# Patient Record
Sex: Female | Born: 2000 | Race: White | Hispanic: No | Marital: Single | Smoking: Never smoker
Health system: Southern US, Community
[De-identification: ages and names within clinical notes are randomized; demographics above are authoritative.]

---
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
## 2021-01-01 ENCOUNTER — Other Ambulatory Visit: Payer: Self-pay

## 2021-01-01 ENCOUNTER — Emergency Department (HOSPITAL_COMMUNITY): Payer: No Typology Code available for payment source

## 2021-01-01 ENCOUNTER — Encounter (HOSPITAL_COMMUNITY): Payer: Self-pay | Admitting: Emergency Medicine

## 2021-01-01 ENCOUNTER — Emergency Department (HOSPITAL_COMMUNITY)
Admission: EM | Admit: 2021-01-01 | Discharge: 2021-01-01 | Disposition: A | Discharge status: Home / Self Care | Payer: No Typology Code available for payment source | Attending: Emergency Medicine | Admitting: Emergency Medicine

## 2021-01-01 DIAGNOSIS — R091 Pleurisy: Secondary | ICD-10-CM

## 2021-01-01 DIAGNOSIS — I498 Other specified cardiac arrhythmias: Secondary | ICD-10-CM | POA: Diagnosis not present

## 2021-01-01 DIAGNOSIS — R0602 Shortness of breath: Secondary | ICD-10-CM | POA: Diagnosis present

## 2021-01-01 LAB — CBC
HCT: 42.9 % (ref 36.0–46.0)
Hemoglobin: 13.7 g/dL (ref 12.0–15.0)
MCH: 28.8 pg (ref 26.0–34.0)
MCHC: 31.9 g/dL (ref 30.0–36.0)
MCV: 90.3 fL (ref 80.0–100.0)
Platelets: 230 10*3/uL (ref 150–400)
RBC: 4.75 MIL/uL (ref 3.87–5.11)
RDW: 12.9 % (ref 11.5–15.5)
WBC: 5.3 10*3/uL (ref 4.0–10.5)
nRBC: 0 % (ref 0.0–0.2)

## 2021-01-01 LAB — BASIC METABOLIC PANEL
Anion gap: 6 (ref 5–15)
BUN: 8 mg/dL (ref 6–20)
CO2: 25 mmol/L (ref 22–32)
Calcium: 9.3 mg/dL (ref 8.9–10.3)
Chloride: 108 mmol/L (ref 98–111)
Creatinine, Ser: 0.76 mg/dL (ref 0.44–1.00)
GFR, Estimated: >60 mL/min (ref >60)
Glucose, Bld: 93 mg/dL (ref 70–99)
Potassium: 3.6 mmol/L (ref 3.5–5.1)
Sodium: 139 mmol/L (ref 135–145)

## 2021-01-01 LAB — I-STAT BETA HCG BLOOD, ED (MC, WL, AP ONLY): I-stat hCG, quantitative: <5 m[IU]/mL (ref <5)

## 2021-01-01 LAB — D-DIMER, QUANTITATIVE: D-Dimer, Quant: <0.27 ug/mL-FEU (ref 0.00–0.50)

## 2021-01-01 LAB — TROPONIN I (HIGH SENSITIVITY): Troponin I (High Sensitivity): <2 ng/L (ref <18)

## 2021-01-01 NOTE — Discharge Instructions (Addendum)
Take ibuprofen 3 times a day with meals.  Do not take other anti-inflammatories at the same time (Advil, Motrin, naproxen, Aleve). You may supplement with Tylenol if you need further pain control. Follow-up with your primary care doctor for recheck of your symptoms and further evaluation of your abnormal EKG. Return to the emergency room with any new, worsening, concerning symptoms

## 2021-01-01 NOTE — ED Notes (Signed)
Medications follow up appts reviewed w/ pt. Evaluated for CP. Denies @ this time.  Workup WNL. Denies questions or concerns @ this time. Education on s/s of worsening and when to return. T Left w/ even and steady gait. NAD noted. Accompanied by mother.

## 2021-01-01 NOTE — ED Triage Notes (Signed)
Patient reports left chest pain for 2 days with SOB , denies cough or fever , no emesis or diaphoresis .

## 2021-01-01 NOTE — ED Provider Notes (Signed)
[facility] EMERGENCY DEPARTMENT Provider Note   CSN: [id number] Arrival date & time: 01/01/21  2002     History Chief Complaint  Patient presents with  . Chest Pain    Holly Kramer is a 20 y.o. female presenting for evaluation of chest pain and shortness of breath.  Patient states for the past 2 days she has had persistent chest pressure.  It starts in the middle of her chest and radiates bilaterally, worse on the left side.  She also occasionally has sharp pain in the left side goes towards her back.  Pressure and pain are present only with inspiration, no pain at rest.  Today she developed associated shortness of breath, feels like she cannot take a deep breath.  Yesterday she took aspirin which improved her symptoms, she has not tried or taken anything else that has helped.  She denies fevers, chills, sore throat, cough, nausea, vomiting abdominal pain.  She denies recent travel, surgeries, immobilization, history of cancer, history of previous DVT/PE, or OCP use.  She was diagnosed with Covid twice in the past year, most recently January 2022.  She had complete recovery from Covid prior to the symptoms.  She has no other medical problems, takes medications daily.   HPI     History reviewed. No pertinent past medical history.  Patient Active Problem List   Diagnosis Date Noted  . Right rotator cuff tendinitis 12/27/2019    History reviewed. No pertinent surgical history.   OB History   No obstetric history on file.     No family history on file.  Social History   Tobacco Use  . Smoking status: Never Smoker  . Smokeless tobacco: Never Used  Substance Use Topics  . Alcohol use: No  . Drug use: No    Home Medications Prior to Admission medications   Medication Sig Start Date End Date Taking? Authorizing Provider  meloxicam (MOBIC) 15 MG tablet Take 1 tablet (15 mg total) by mouth daily. 12/27/19   Judi Saa, DO    Allergies    Patient has  no known allergies.  Review of Systems   Review of Systems  Respiratory: Positive for shortness of breath.   Cardiovascular: Positive for chest pain.  All other systems reviewed and are negative.   Physical Exam Updated Vital Signs BP 117/68 (BP Location: Left Arm)   Pulse 80   Temp 98 F (36.7 C)   Resp 18   Ht 6' (1.829 m)   Wt 85 kg   SpO2 98%   BMI 25.41 kg/m   Physical Exam Vitals and nursing note reviewed.  Constitutional:      General: She is not in acute distress.    Appearance: She is well-developed.     Comments: Resting in the bed in no acute distress  HENT:     Head: Normocephalic and atraumatic.  Eyes:     Conjunctiva/sclera: Conjunctivae normal.     Pupils: Pupils are equal, round, and reactive to light.  Cardiovascular:     Rate and Rhythm: Normal rate and regular rhythm.     Pulses: Normal pulses.  Pulmonary:     Effort: Pulmonary effort is normal. No respiratory distress.     Breath sounds: Normal breath sounds. No wheezing.     Comments: Speaking in full sentences.  Clear lung sounds in all fields. Abdominal:     General: There is no distension.     Palpations: Abdomen is soft. There is no mass.  Tenderness: There is no abdominal tenderness. There is no guarding or rebound.  Musculoskeletal:        General: Normal range of motion.     Cervical back: Normal range of motion and neck supple.     Right lower leg: No edema.     Left lower leg: No edema.  Skin:    General: Skin is warm and dry.  Neurological:     Mental Status: She is alert and oriented to person, place, and time.     ED Results / Procedures / Treatments   Labs (all labs ordered are listed, but only abnormal results are displayed) Labs Reviewed  BASIC METABOLIC PANEL  CBC  D-DIMER, QUANTITATIVE  I-STAT BETA HCG BLOOD, ED ([facility], [facility], [facility] ONLY)  TROPONIN I (HIGH SENSITIVITY)    EKG None  Radiology DG Chest 2 View  Result Date: 01/01/2021 CLINICAL DATA:  Mid chest  pressure, chest tightness radiating to left side, short of breath EXAM: CHEST - 2 VIEW COMPARISON:  None. FINDINGS: The heart size and mediastinal contours are within normal limits. Both lungs are clear. The visualized skeletal structures are unremarkable. IMPRESSION: No active cardiopulmonary disease. Electronically Signed   By: Sharlet Salina M.D.   On: 01/01/2021 21:24    Procedures Procedures   Medications Ordered in ED Medications - No data to display  ED Course  I have reviewed the triage vital signs and the nursing notes.  Pertinent labs & imaging results that were available during my care of the patient were reviewed by me and considered in my medical decision making (see chart for details).    MDM Rules/Calculators/A&P                          Patient presenting for evaluation of pleurisy symptoms.  On exam, patient appears nontoxic.  She does not have any PE risk factors, however she was diagnosed with Covid several months ago.  Likely pleurisy, however patient and mom are extremely concerned about possible PE.  Will obtain dimer to rule out.  Initially patient was tachycardic, although this resolved without intervention and on my evaluation she is not tachycardic.  Labs obtained in triage interpreted by me, overall reassuring.  No leukocytosis.  Hemoglobin stable.  Electrolyte stable.  Troponin negative.  EKG shows sinus arrhythmia, but no sign of ischemia.  Chest x-ray viewed interpreted by me, no pneumonia pneumothorax or effusion.  D-dimer negative.  Patient remains stable.  Discussed findings with patient and mom.  Discussed follow-up with primary care for further evaluation of symptoms.  Treatment with NSAIDs.  At this time, patient appears safe for discharge.  Return precautions given.  Patient states she understands and agrees to plan.   Final Clinical Impression(s) / ED Diagnoses Final diagnoses:  Pleurisy  Sinus arrhythmia seen on electrocardiogram    Rx / DC  Orders ED Discharge Orders    None       Alveria Apley, PA-C 01/01/21 2332    Cheryll Cockayne, MD 01/01/21 2336

## 2022-01-07 IMAGING — DX DG CHEST 2V
2 series · 2 of 2 positions shown · non-contrast
Comparison: None.

CLINICAL DATA: Mid chest pressure, chest tightness radiating to
left side, short of breath

EXAM:
CHEST - 2 VIEW

[chest pa]
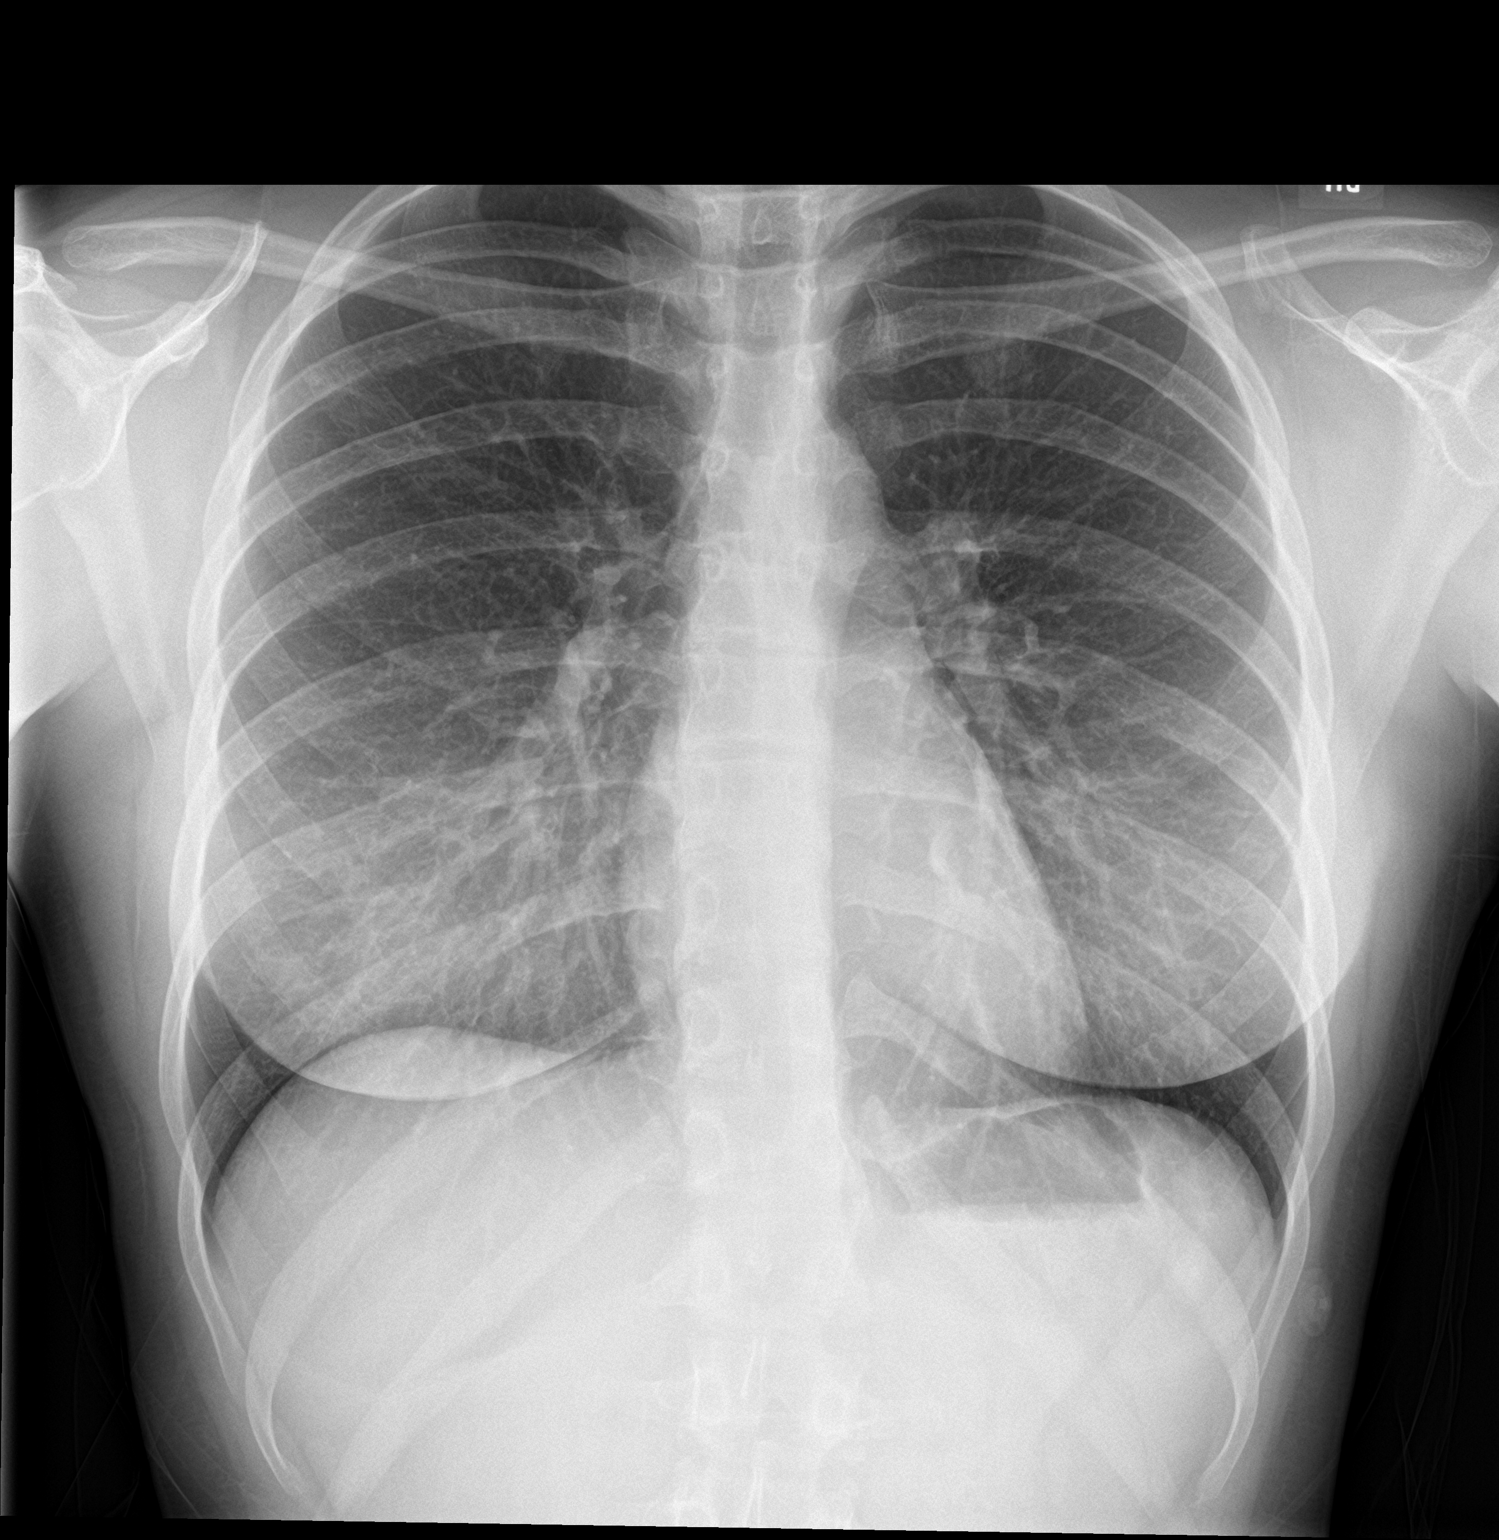

[chest lat]
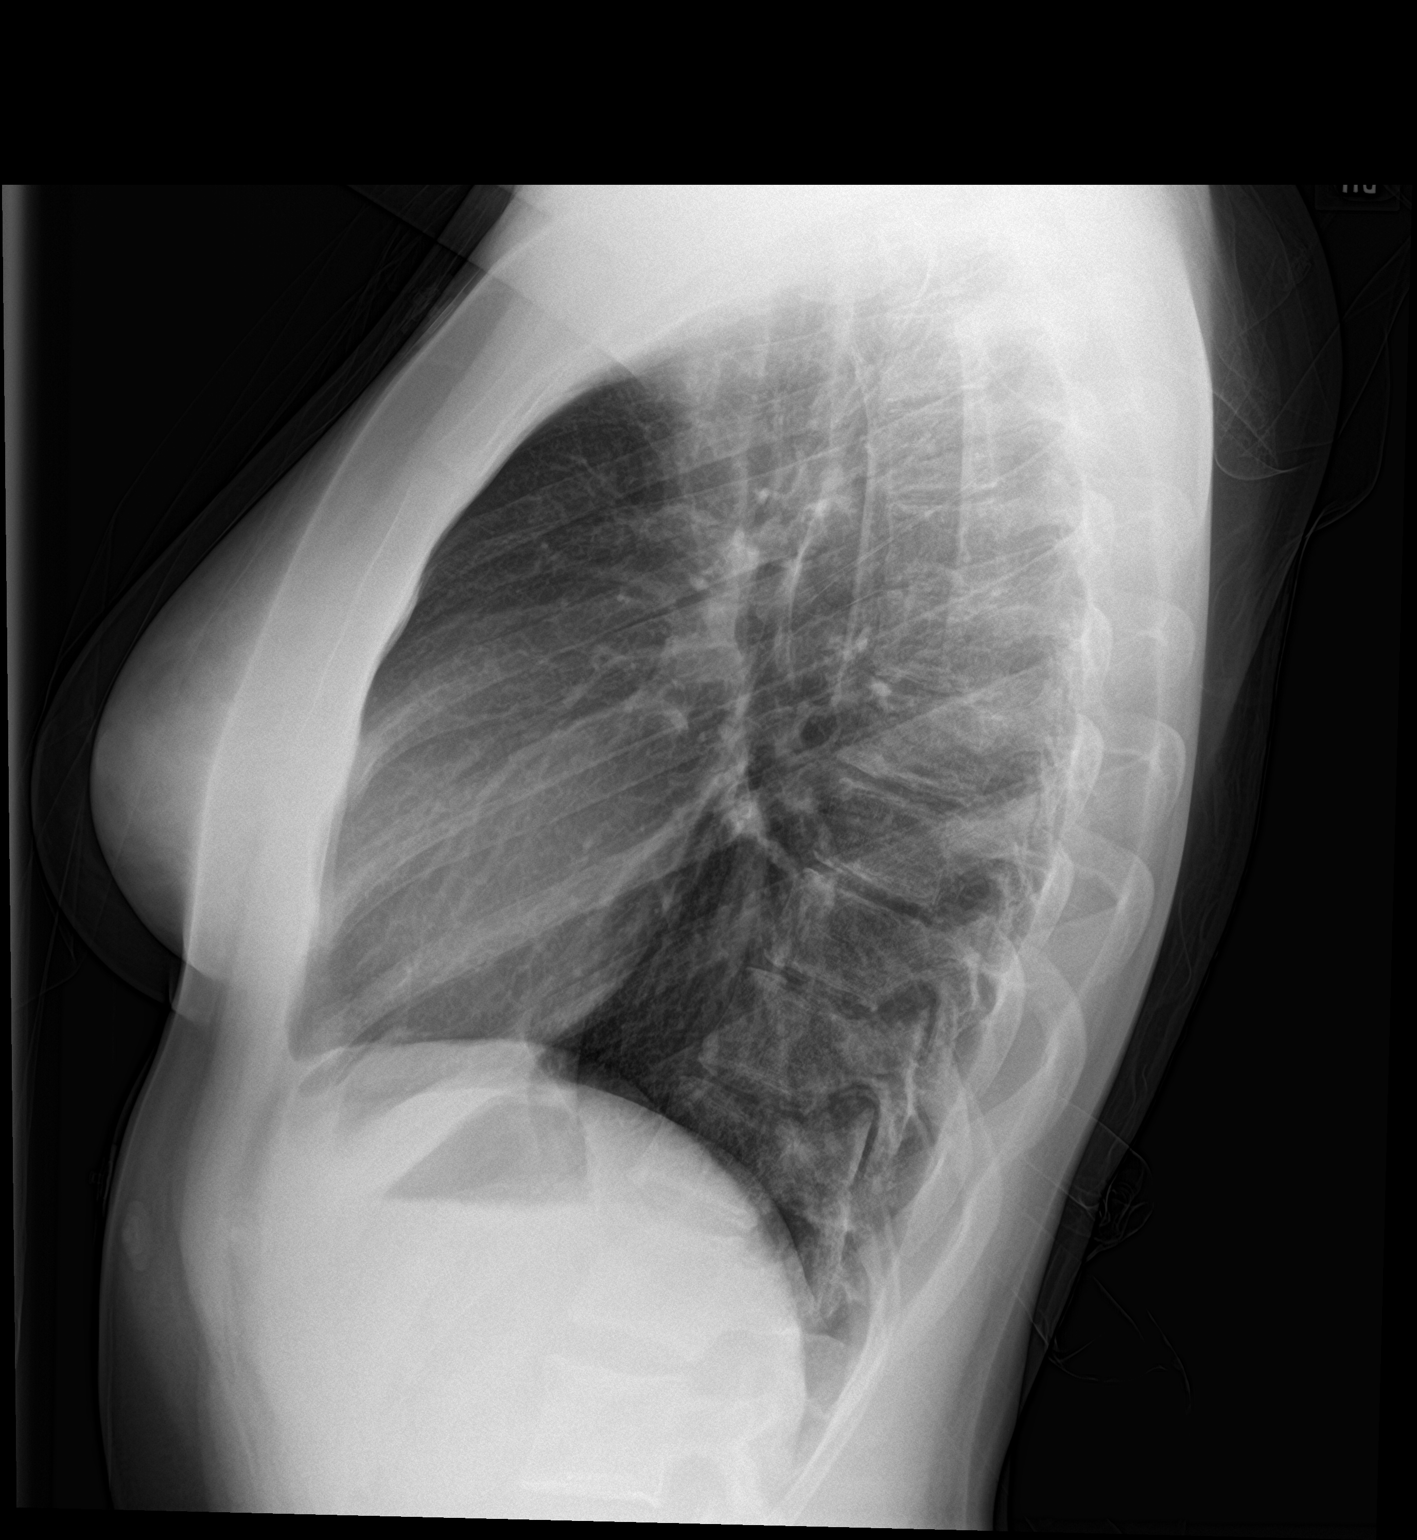

[2 of 2 positions shown; findings below may reference images not displayed]

FINDINGS: The heart size and mediastinal contours are within normal limits.
Both lungs are clear. The visualized skeletal structures are
unremarkable.
IMPRESSION: No active cardiopulmonary disease.

## 2022-08-03 DIAGNOSIS — Z0000 Encounter for general adult medical examination without abnormal findings: Secondary | ICD-10-CM | POA: Diagnosis not present

## 2022-08-04 DIAGNOSIS — Z13220 Encounter for screening for lipoid disorders: Secondary | ICD-10-CM | POA: Diagnosis not present

## 2022-08-04 DIAGNOSIS — Z0000 Encounter for general adult medical examination without abnormal findings: Secondary | ICD-10-CM | POA: Diagnosis not present

## 2022-08-04 DIAGNOSIS — Z131 Encounter for screening for diabetes mellitus: Secondary | ICD-10-CM | POA: Diagnosis not present

## 2022-08-31 DIAGNOSIS — Z3042 Encounter for surveillance of injectable contraceptive: Secondary | ICD-10-CM | POA: Diagnosis not present
